# Patient Record
Sex: Female | Born: 1975 | Race: Black or African American | Hispanic: No | Marital: Married | Smoking: Never smoker
Health system: Southern US, Community
[De-identification: ages and names within clinical notes are randomized; demographics above are authoritative.]

## PROBLEM LIST (undated history)

## (undated) DIAGNOSIS — K589 Irritable bowel syndrome without diarrhea: Secondary | ICD-10-CM

## (undated) DIAGNOSIS — Z8619 Personal history of other infectious and parasitic diseases: Secondary | ICD-10-CM

## (undated) DIAGNOSIS — O09219 Supervision of pregnancy with history of pre-term labor, unspecified trimester: Secondary | ICD-10-CM

## (undated) DIAGNOSIS — I10 Essential (primary) hypertension: Secondary | ICD-10-CM

## (undated) DIAGNOSIS — O169 Unspecified maternal hypertension, unspecified trimester: Secondary | ICD-10-CM

## (undated) DIAGNOSIS — D6861 Antiphospholipid syndrome: Secondary | ICD-10-CM

## (undated) HISTORY — DX: Personal history of other infectious and parasitic diseases: Z86.19

## (undated) HISTORY — DX: Supervision of pregnancy with history of pre-term labor, unspecified trimester: O09.219

## (undated) HISTORY — DX: Irritable bowel syndrome, unspecified: K58.9

## (undated) HISTORY — DX: Unspecified maternal hypertension, unspecified trimester: O16.9

## (undated) HISTORY — DX: Essential (primary) hypertension: I10

## (undated) HISTORY — DX: Antiphospholipid syndrome: D68.61

---

## 2010-08-11 NOTE — L&D Delivery Note (Signed)
Delivery Note At 6:16 PM a viable and healthy female was delivered via Vaginal, Spontaneous Delivery (Presentation: Occiput Posterior).  APGAR: 9, 9; weight 7 lb 12 oz (3515 g).   Placenta status: complete, spontaneous. Cord: 3 vessels with the following complications: None.  Cord pH: not indicated.   Anesthesia: Epidural  Episiotomy: None Lacerations: 2nd degree;Perineal Suture Repair: 2.0 Vicryl Est. Blood Loss (mL): 400  Mom to postpartum.  Baby to with mother.  Clay Menser R 07/23/2011, 6:39 PM

## 2011-01-09 LAB — RUBELLA ANTIBODY, IGM: Rubella: IMMUNE

## 2011-01-09 LAB — RPR: RPR: NONREACTIVE

## 2011-01-09 LAB — ABO/RH: RH Type: POSITIVE

## 2011-02-24 ENCOUNTER — Other Ambulatory Visit (HOSPITAL_COMMUNITY): Payer: Self-pay | Admitting: Obstetrics & Gynecology

## 2011-02-24 DIAGNOSIS — O2690 Pregnancy related conditions, unspecified, unspecified trimester: Secondary | ICD-10-CM

## 2011-02-24 DIAGNOSIS — O3430 Maternal care for cervical incompetence, unspecified trimester: Secondary | ICD-10-CM

## 2011-02-24 DIAGNOSIS — Z3689 Encounter for other specified antenatal screening: Secondary | ICD-10-CM

## 2011-02-28 ENCOUNTER — Encounter (HOSPITAL_COMMUNITY): Payer: Self-pay

## 2011-02-28 ENCOUNTER — Ambulatory Visit (HOSPITAL_COMMUNITY)
Admission: RE | Admit: 2011-02-28 | Discharge: 2011-02-28 | Disposition: A | Discharge status: Home / Self Care | Payer: BC Managed Care – PPO | Source: Ambulatory Visit | Attending: Obstetrics & Gynecology | Admitting: Obstetrics & Gynecology

## 2011-02-28 DIAGNOSIS — O2690 Pregnancy related conditions, unspecified, unspecified trimester: Secondary | ICD-10-CM

## 2011-02-28 DIAGNOSIS — O358XX0 Maternal care for other (suspected) fetal abnormality and damage, not applicable or unspecified: Secondary | ICD-10-CM | POA: Insufficient documentation

## 2011-02-28 DIAGNOSIS — Z3689 Encounter for other specified antenatal screening: Secondary | ICD-10-CM

## 2011-02-28 DIAGNOSIS — Z363 Encounter for antenatal screening for malformations: Secondary | ICD-10-CM | POA: Insufficient documentation

## 2011-02-28 DIAGNOSIS — Z1389 Encounter for screening for other disorder: Secondary | ICD-10-CM | POA: Insufficient documentation

## 2011-02-28 DIAGNOSIS — O3430 Maternal care for cervical incompetence, unspecified trimester: Secondary | ICD-10-CM

## 2011-02-28 DIAGNOSIS — O2620 Pregnancy care for patient with recurrent pregnancy loss, unspecified trimester: Secondary | ICD-10-CM | POA: Insufficient documentation

## 2011-03-03 NOTE — Progress Notes (Signed)
MFM Consult Note  Ms. Monica Arroyo is a 35 year old G50P1A3 AA female at 18+[redacted] weeks gestation who presents for an ultrasound and consultation regarding the need for a cervical cerclage. Her pregnancies are as follows:  G1: SAB in first trimester; no D&C G2: SAB in first trimester; no D&C G3: SROM at 20 weeks with delivery 2 days later; denies contractions G4: Early cervical change with hospitalization at 24 weeks for BMZ; bedrest at home; induced at 38 weeks due to her diagnosis of antiphospholipid syndrome G5: Current: 17P starting at 16 weeks; serial CLs show no funneling but some shortening 3.2 - 2.4 cms  Ms. Monica Arroyo also has a diagnosis of chronic hypertension not requiring medication and APL syndrome for which she takes daily Lovenox injections.  Today's US revealed the cervix to be of normal length without funneling. We reviewed the literature concerning the efficacy of cerclages. Unfortunately, it is not clear if or when a cerclage is appropriate or would be of benefit. After a lengthy discussion, Ms. Monica Arroyo and I decided to continue the cervical CLs every 1-2 weeks. If there would be significant change before 23 weeks, a rescue cerclage could be considered; otherwise, would continue CLs until 26-28 weeks. Suggest giving BMZ with significant change after 23 weeks.   (Face-to-face consultation with patient: 30 min)

## 2011-03-12 ENCOUNTER — Inpatient Hospital Stay (HOSPITAL_COMMUNITY): Admission: AD | Admit: 2011-03-12 | Payer: Self-pay | Source: Ambulatory Visit | Admitting: Obstetrics & Gynecology

## 2011-07-21 ENCOUNTER — Telehealth (HOSPITAL_COMMUNITY): Payer: Self-pay | Admitting: *Deleted

## 2011-07-21 ENCOUNTER — Encounter (HOSPITAL_COMMUNITY): Payer: Self-pay | Admitting: *Deleted

## 2011-07-21 NOTE — Telephone Encounter (Signed)
Preadmission screen  

## 2011-07-23 ENCOUNTER — Inpatient Hospital Stay (HOSPITAL_COMMUNITY)
Admission: RE | Admit: 2011-07-23 | Discharge: 2011-07-25 | DRG: 372 | Disposition: A | Discharge status: Home / Self Care | Payer: BC Managed Care – PPO | Source: Ambulatory Visit | Attending: Obstetrics & Gynecology | Admitting: Obstetrics & Gynecology

## 2011-07-23 ENCOUNTER — Encounter (HOSPITAL_COMMUNITY): Payer: Self-pay

## 2011-07-23 ENCOUNTER — Inpatient Hospital Stay (HOSPITAL_COMMUNITY): Payer: BC Managed Care – PPO | Admitting: Anesthesiology

## 2011-07-23 ENCOUNTER — Encounter (HOSPITAL_COMMUNITY): Payer: Self-pay | Admitting: Anesthesiology

## 2011-07-23 ENCOUNTER — Inpatient Hospital Stay (HOSPITAL_COMMUNITY): Admission: AD | Admit: 2011-07-23 | Payer: Self-pay | Source: Ambulatory Visit | Admitting: Obstetrics & Gynecology

## 2011-07-23 DIAGNOSIS — O9912 Other diseases of the blood and blood-forming organs and certain disorders involving the immune mechanism complicating childbirth: Secondary | ICD-10-CM | POA: Diagnosis present

## 2011-07-23 DIAGNOSIS — O1002 Pre-existing essential hypertension complicating childbirth: Principal | ICD-10-CM | POA: Diagnosis present

## 2011-07-23 DIAGNOSIS — D6859 Other primary thrombophilia: Secondary | ICD-10-CM | POA: Diagnosis present

## 2011-07-23 DIAGNOSIS — D6861 Antiphospholipid syndrome: Secondary | ICD-10-CM | POA: Diagnosis present

## 2011-07-23 DIAGNOSIS — D689 Coagulation defect, unspecified: Secondary | ICD-10-CM | POA: Diagnosis present

## 2011-07-23 LAB — CBC
MCH: 32.5 pg (ref 26.0–34.0)
MCV: 94 fL (ref 78.0–100.0)
Platelets: 215 10*3/uL (ref 150–400)
RBC: 3.51 MIL/uL — ABNORMAL LOW (ref 3.87–5.11)

## 2011-07-23 LAB — RPR: RPR Ser Ql: NONREACTIVE

## 2011-07-23 MED ORDER — WITCH HAZEL-GLYCERIN EX PADS: 1.0000 "application " | MEDICATED_PAD | CUTANEOUS | Status: DC | PRN

## 2011-07-23 MED ORDER — TETANUS-DIPHTH-ACELL PERTUSSIS 5-2.5-18.5 LF-MCG/0.5 IM SUSP
0.5000 mL | Freq: Once | INTRAMUSCULAR | Status: AC
  Administered 2011-07-24: 0.5 mL via INTRAMUSCULAR
  Filled 2011-07-23: qty 0.5

## 2011-07-23 MED ORDER — EPHEDRINE 5 MG/ML INJ
10.0000 mg | INTRAVENOUS | Status: DC | PRN
  Filled 2011-07-23: qty 4

## 2011-07-23 MED ORDER — OXYTOCIN 20 UNITS IN LACTATED RINGERS INFUSION - SIMPLE
1.0000 m[IU]/min | INTRAVENOUS | Status: DC
  Administered 2011-07-23: 6 m[IU]/min via INTRAVENOUS
  Administered 2011-07-23: 2 m[IU]/min via INTRAVENOUS
  Administered 2011-07-23: 4 m[IU]/min via INTRAVENOUS
  Administered 2011-07-23: 3 m[IU]/min via INTRAVENOUS
  Administered 2011-07-23: 2 m[IU]/min via INTRAVENOUS
  Administered 2011-07-23: 5 m[IU]/min via INTRAVENOUS
  Filled 2011-07-23: qty 1000

## 2011-07-23 MED ORDER — DIPHENHYDRAMINE HCL 50 MG/ML IJ SOLN: 12.5000 mg | INTRAMUSCULAR | Status: DC | PRN

## 2011-07-23 MED ORDER — SIMETHICONE 80 MG PO CHEW: 80.0000 mg | CHEWABLE_TABLET | ORAL | Status: DC | PRN

## 2011-07-23 MED ORDER — LIDOCAINE HCL (PF) 1 % IJ SOLN
30.0000 mL | INTRAMUSCULAR | Status: DC | PRN
  Filled 2011-07-23: qty 30

## 2011-07-23 MED ORDER — IBUPROFEN 600 MG PO TABS
600.0000 mg | ORAL_TABLET | Freq: Four times a day (QID) | ORAL | Status: DC | PRN
  Administered 2011-07-23: 600 mg via ORAL
  Filled 2011-07-23: qty 1

## 2011-07-23 MED ORDER — ONDANSETRON HCL 4 MG PO TABS: 4.0000 mg | ORAL_TABLET | ORAL | Status: DC | PRN

## 2011-07-23 MED ORDER — DIBUCAINE 1 % RE OINT: 1.0000 "application " | TOPICAL_OINTMENT | RECTAL | Status: DC | PRN

## 2011-07-23 MED ORDER — ONDANSETRON HCL 4 MG/2ML IJ SOLN: 4.0000 mg | Freq: Four times a day (QID) | INTRAMUSCULAR | Status: DC | PRN

## 2011-07-23 MED ORDER — LACTATED RINGERS IV SOLN: 500.0000 mL | Freq: Once | INTRAVENOUS | Status: DC

## 2011-07-23 MED ORDER — OXYTOCIN BOLUS FROM INFUSION
500.0000 mL | Freq: Once | INTRAVENOUS | Status: DC
  Filled 2011-07-23: qty 500

## 2011-07-23 MED ORDER — IBUPROFEN 600 MG PO TABS
600.0000 mg | ORAL_TABLET | Freq: Four times a day (QID) | ORAL | Status: DC
  Administered 2011-07-24 – 2011-07-25 (×6): 600 mg via ORAL
  Filled 2011-07-23 (×6): qty 1

## 2011-07-23 MED ORDER — LACTATED RINGERS IV SOLN
500.0000 mL | INTRAVENOUS | Status: DC | PRN
  Administered 2011-07-23 (×2): 1000 mL via INTRAVENOUS
  Administered 2011-07-23: 500 mL via INTRAVENOUS

## 2011-07-23 MED ORDER — ZOLPIDEM TARTRATE 5 MG PO TABS: 5.0000 mg | ORAL_TABLET | Freq: Every evening | ORAL | Status: DC | PRN

## 2011-07-23 MED ORDER — SENNOSIDES-DOCUSATE SODIUM 8.6-50 MG PO TABS
2.0000 | ORAL_TABLET | Freq: Every day | ORAL | Status: DC
  Administered 2011-07-23 – 2011-07-24 (×2): 2 via ORAL

## 2011-07-23 MED ORDER — FENTANYL 2.5 MCG/ML BUPIVACAINE 1/10 % EPIDURAL INFUSION (WH - ANES)
INTRAMUSCULAR | Status: DC | PRN
  Administered 2011-07-23: 13 mL/h via EPIDURAL

## 2011-07-23 MED ORDER — ONDANSETRON HCL 4 MG/2ML IJ SOLN: 4.0000 mg | INTRAMUSCULAR | Status: DC | PRN

## 2011-07-23 MED ORDER — DIPHENHYDRAMINE HCL 25 MG PO CAPS: 25.0000 mg | ORAL_CAPSULE | Freq: Four times a day (QID) | ORAL | Status: DC | PRN

## 2011-07-23 MED ORDER — PHENYLEPHRINE 40 MCG/ML (10ML) SYRINGE FOR IV PUSH (FOR BLOOD PRESSURE SUPPORT)
80.0000 ug | PREFILLED_SYRINGE | INTRAVENOUS | Status: DC | PRN
  Filled 2011-07-23: qty 5

## 2011-07-23 MED ORDER — FENTANYL 2.5 MCG/ML BUPIVACAINE 1/10 % EPIDURAL INFUSION (WH - ANES)
14.0000 mL/h | INTRAMUSCULAR | Status: DC
  Filled 2011-07-23: qty 60

## 2011-07-23 MED ORDER — OXYTOCIN 20 UNITS IN LACTATED RINGERS INFUSION - SIMPLE: 125.0000 mL/h | Freq: Once | INTRAVENOUS | Status: DC

## 2011-07-23 MED ORDER — LANOLIN HYDROUS EX OINT: TOPICAL_OINTMENT | CUTANEOUS | Status: DC | PRN

## 2011-07-23 MED ORDER — LIDOCAINE HCL 1.5 % IJ SOLN
INTRAMUSCULAR | Status: DC | PRN
  Administered 2011-07-23 (×2): 4 mL via EPIDURAL

## 2011-07-23 MED ORDER — LACTATED RINGERS IV SOLN
INTRAVENOUS | Status: DC
  Administered 2011-07-23: 09:00:00 via INTRAVENOUS

## 2011-07-23 MED ORDER — OXYCODONE-ACETAMINOPHEN 5-325 MG PO TABS: 2.0000 | ORAL_TABLET | ORAL | Status: DC | PRN

## 2011-07-23 MED ORDER — PHENYLEPHRINE 40 MCG/ML (10ML) SYRINGE FOR IV PUSH (FOR BLOOD PRESSURE SUPPORT): 80.0000 ug | PREFILLED_SYRINGE | INTRAVENOUS | Status: DC | PRN

## 2011-07-23 MED ORDER — CITRIC ACID-SODIUM CITRATE 334-500 MG/5ML PO SOLN: 30.0000 mL | ORAL | Status: DC | PRN

## 2011-07-23 MED ORDER — OXYCODONE-ACETAMINOPHEN 5-325 MG PO TABS: 1.0000 | ORAL_TABLET | ORAL | Status: DC | PRN

## 2011-07-23 MED ORDER — BENZOCAINE-MENTHOL 20-0.5 % EX AERO
1.0000 "application " | INHALATION_SPRAY | CUTANEOUS | Status: DC | PRN
  Administered 2011-07-25: 1 via TOPICAL

## 2011-07-23 MED ORDER — PRENATAL PLUS 27-1 MG PO TABS
1.0000 | ORAL_TABLET | Freq: Every day | ORAL | Status: DC
  Administered 2011-07-24 – 2011-07-25 (×2): 1 via ORAL
  Filled 2011-07-23 (×2): qty 1

## 2011-07-23 MED ORDER — TERBUTALINE SULFATE 1 MG/ML IJ SOLN: 0.2500 mg | Freq: Once | INTRAMUSCULAR | Status: DC | PRN

## 2011-07-23 MED ORDER — EPHEDRINE 5 MG/ML INJ: 10.0000 mg | INTRAVENOUS | Status: DC | PRN

## 2011-07-23 MED ORDER — FLEET ENEMA 7-19 GM/118ML RE ENEM: 1.0000 | ENEMA | RECTAL | Status: DC | PRN

## 2011-07-23 MED ORDER — ACETAMINOPHEN 325 MG PO TABS: 650.0000 mg | ORAL_TABLET | ORAL | Status: DC | PRN

## 2011-07-23 NOTE — H&P (Signed)
Monica Arroyo is a 35 y.o. female presenting for labor IOL at 39 wks for Antiphospholipid ab.syndrome.  History [facility] at [facility] from 8 wks. Prior high risk hx, with 2 MAB in 1st trim, one 20 wks loss and one successful preg with preterm dilatation, bedrest with delivery at 38 wks. Hence placed on prophylactic Lovenox 40mg  daily and switched to Heparin since 36 wks. 17P inj since prior preterm dilatation without PTD.  CHTN stable, off meds. No preclampsia s/s.    OB History    Grav Para Term Preterm Abortions TAB SAB Ect Mult Living   5 2 1 1 2  0 2 0 0 1     Past Medical History  Diagnosis Date  . Pregnancy with history of pre-term labor   . Unspecified hypertension antepartum   . Unspecified essential hypertension .  Marland Kitchen Decreased fetal movements   . History of chicken pox   . IBS (irritable bowel syndrome)   . Antiphospholipid syndrome    No past surgical history on file. no surgeries.   Family History: family history includes Alcohol abuse in her brother; Diabetes in her father; Hearing loss in her maternal uncle; Heart attack in her maternal uncle; Hypertension in her mother; and Stroke in her paternal grandmother.  Social History:  reports that she has never smoked. She has never used smokeless tobacco. She reports that she does not drink alcohol or use illicit drugs.  Review of Systems  Constitutional: Negative for malaise/fatigue.  Eyes: Negative for blurred vision.  Cardiovascular: Negative for chest pain and leg swelling.  Gastrointestinal: Negative for heartburn.  Neurological: Negative for focal weakness and headaches.  Psychiatric/Behavioral: Negative for depression.     Last menstrual period 10/22/2010. Exam Physical Exam A&O x 3, no acute distress. Pleasant HEENT neg, no thyromegaly Lungs CTA bilat CV RRR, S1S2 normal Abdo soft, non tender, non acute Extr no edema/ tenderness Pelvic 2 cm/ 50% in office/ Vtx by exam and last office sono on 12/7.  FHT  140s/ +  acels/ no decels. Mod LTV, category I Toco occ Prenatal labs: ABO, Rh: A/Positive/-- (05/31 0000) Antibody: Negative (05/31 0000) Rubella: Immune (05/31 0000) RPR: Nonreactive (05/31 0000)  HBsAg: Negative (05/31 0000)  HIV: Non-reactive (05/31 0000)  GBS: Negative (11/16 0000)  Ultrascreen neg, AFP1 negative. 1 hr Glucola nl Ob anatomy sono nl. Serial sono normal growth.   Assessment/Plan: [id number] at 39.1/7 wks, for IOL for APL ab syndrome. Last Heparin inj 24 hrs back. GBS neg. Plan pitocin IOL, epidural per pt's choice.    Shalice Woodring R 07/23/2011, 7:16 AM

## 2011-07-23 NOTE — Anesthesia Preprocedure Evaluation (Signed)
Anesthesia Evaluation  Patient identified by MRN, date of birth, ID band Patient awake    Reviewed: Allergy & Precautions, H&P , Patient's Chart, lab work & pertinent test results  Airway Mallampati: III TM Distance: >3 FB Neck ROM: full    Dental No notable dental hx. (+) Teeth Intact   Pulmonary neg pulmonary ROS,  clear to auscultation  Pulmonary exam normal       Cardiovascular neg cardio ROS regular Normal    Neuro/Psych Negative Neurological ROS  Negative Psych ROS   GI/Hepatic negative GI ROS, Neg liver ROS, IBS   Endo/Other  Negative Endocrine ROS  Renal/GU negative Renal ROS  Genitourinary negative   Musculoskeletal   Abdominal Normal abdominal exam  (+)   Peds  Hematology Anti-Phospholipid antibody. On Heparin SQ last dose on 07/21/11.   Anesthesia Other Findings   Reproductive/Obstetrics (+) Pregnancy                           Anesthesia Physical Anesthesia Plan  ASA: II  Anesthesia Plan: Epidural   Post-op Pain Management:    Induction:   Airway Management Planned:   Additional Equipment:   Intra-op Plan:   Post-operative Plan:   Informed Consent: I have reviewed the patients History and Physical, chart, labs and discussed the procedure including the risks, benefits and alternatives for the proposed anesthesia with the patient or authorized representative who has indicated his/her understanding and acceptance.     Plan Discussed with: Anesthesiologist and Surgeon  Anesthesia Plan Comments:         Anesthesia Quick Evaluation

## 2011-07-23 NOTE — H&P (Signed)
Monica Arroyo is a 35 y.o. female presenting for labor IOL at 39 wks for Antiphospholipid ab.syndrome.  History  [facility] at [facility] from 8 wks. Prior high risk hx, with 2 MAB in 1st trim, one 20 wks loss and one successful preg with preterm dilatation, bedrest with delivery at 38 wks. Hence placed on prophylactic Lovenox 40mg  daily and switched to Heparin since 36 wks. 17P inj since prior preterm dilatation without PTD.  CHTN stable, off meds. No preclampsia s/s.    OB History    Grav Para Term Preterm Abortions TAB SAB Ect Mult Living   5 2 1 1 2  0 2 0 0 1     Past Medical History  Diagnosis Date  . Pregnancy with history of pre-term labor   . Unspecified hypertension antepartum   . Unspecified essential hypertension .  Marland Kitchen Decreased fetal movements   . History of chicken pox   . IBS (irritable bowel syndrome)   . Antiphospholipid syndrome    History reviewed. No pertinent past surgical history. no surgeries.   Family History: family history includes Alcohol abuse in her brother; Diabetes in her father; Hearing loss in her maternal uncle; Heart attack in her maternal uncle; Hypertension in her mother; and Stroke in her paternal grandmother.  Social History:  reports that she has never smoked. She has never used smokeless tobacco. She reports that she does not drink alcohol or use illicit drugs.  Review of Systems  Constitutional: Negative for malaise/fatigue.  Eyes: Negative for blurred vision.  Cardiovascular: Negative for chest pain and leg swelling.  Gastrointestinal: Negative for heartburn.  Neurological: Negative for focal weakness and headaches.  Psychiatric/Behavioral: Negative for depression.     Last menstrual period 10/22/2010. Exam  Physical Exam  A&O x 3, no acute distress. Pleasant HEENT neg, no thyromegaly Lungs CTA bilat CV RRR, S1S2 normal Abdo soft, non tender, non acute Extr no edema/ tenderness Pelvic 2 cm/ 50% in office/ Vtx by exam and last office sono  on 12/7.  FHT  140s/ + accels/ no decels/moderate variability - category I Toco  none Prenatal labs: ABO, Rh: A/Positive/-- (05/31 0000) Antibody: Negative (05/31 0000) Rubella: Immune (05/31 0000) RPR: Nonreactive (05/31 0000)  HBsAg: Negative (05/31 0000)  HIV: Non-reactive (05/31 0000)  GBS: Negative (11/16 0000)  Ultrascreen neg, AFP1 negative. 1 hr Glucola nl Ob anatomy sono nl. Serial sono normal growth.   Assessment/Plan: [id number] at 39.1/7 wks, for IOL for APL ab syndrome. Last Heparin inj 24 hrs back. GBS neg. Plan pitocin IOL, epidural per pt's choice.    Monica Arroyo 07/23/2011, 8:47 AM

## 2011-07-23 NOTE — Anesthesia Procedure Notes (Signed)
Epidural Patient location during procedure: OB Start time: 07/23/2011 2:51 PM  Staffing Anesthesiologist: Chameka Mcmullen A. Performed by: anesthesiologist   Preanesthetic Checklist Completed: patient identified, site marked, surgical consent, pre-op evaluation, timeout performed, IV checked, risks and benefits discussed and monitors and equipment checked  Epidural Patient position: sitting Prep: site prepped and draped and DuraPrep Patient monitoring: continuous pulse ox and blood pressure Approach: midline Injection technique: LOR air  Needle:  Needle type: Tuohy  Needle gauge: 17 G Needle length: 9 cm Needle insertion depth: 8 cm Catheter type: closed end flexible Catheter size: 19 Gauge Catheter at skin depth: 12 cm Test dose: negative and 1.5% lidocaine  Assessment Events: blood not aspirated, injection not painful, no injection resistance, negative IV test and no paresthesia  Additional Notes Patient is more comfortable after epidural dosed. Please see RN's note for documentation of vital signs and FHR which are stable.

## 2011-07-23 NOTE — Progress Notes (Signed)
Patient ID: Monica Arroyo, female   DOB: July 01, 1976, 35 y.o.   MRN: [id number] VS stable. Comfortable with UCs on pitocin. FHT 135/ + accels/ no decels/ mod LTV. Category I. Toco mild to palpate/ SVE 3/50%/-3/ Vtx, AROM, clear. Continue pit per protocol.

## 2011-07-23 NOTE — Progress Notes (Signed)
Patient ID: Monica Arroyo, female   DOB: 1975-12-08, 35 y.o.   MRN: [id number] IOL for APL ab.syndrome. On pitocin,s/p epidural. Pt has series of variable decels after epidural. Hence pitocin was turned off. We discussed terbutaline inj, but was needed since UCs spaced out and decels resolved. Now pt is stable, with pitocin off and left lat, O2 mask. FHT 120s/ moderate variability/ +accels/ no decels- category I. Toco - occ. SVE- complete but at 0 to +1, no caput. Plan to rptate pt in bed to assist descent. Reassess in 1 hr. Restart pitocin at low dose.

## 2011-07-24 LAB — CBC
HCT: 30.2 % — ABNORMAL LOW (ref 36.0–46.0)
Hemoglobin: 10.6 g/dL — ABNORMAL LOW (ref 12.0–15.0)
MCH: 33 pg (ref 26.0–34.0)
RBC: 3.21 MIL/uL — ABNORMAL LOW (ref 3.87–5.11)

## 2011-07-24 NOTE — Anesthesia Postprocedure Evaluation (Signed)
Anesthesia Post Note  Patient: Monica Arroyo  Procedure(s) Performed: * No procedures listed *  Anesthesia type: Epidural  Patient location: Mother/Baby  Post pain: Pain level controlled  Post assessment: Post-op Vital signs reviewed  Last Vitals:  Filed Vitals:   07/24/11 0516  BP: 116/75  Pulse: 71  Temp: 36.8 C  Resp: 18    Post vital signs: Reviewed  Level of consciousness:alert  Complications: No apparent anesthesia complications

## 2011-07-24 NOTE — Progress Notes (Signed)
Patient ID: Monica Arroyo, female   DOB: 1976/01/28, 35 y.o.   MRN: [id number]  PPD 1 SVD  S:  Reports feeling well -no pain             Tolerating po/ No nausea or vomiting             Bleeding is light             Pain controlled withnone and motrin only             Up ad lib / ambulatory  Newborn breast feeding  / female newborn   O:  A & O x 3 NAD             VS: Blood pressure 116/75, pulse 71, temperature 98.3 F (36.8 C), temperature source Oral, resp. rate 18, height 5\' 10"  (1.778 m), weight 114.306 kg (252 lb), last menstrual period 10/22/2010, SpO2 100.00%, unknown if currently breastfeeding.  LABS: Lab Results  Component Value Date   WBC 8.9 07/24/2011   HGB 10.6* 07/24/2011   HCT 30.2* 07/24/2011   MCV 94.1 07/24/2011   PLT 187 07/24/2011     I&O: I/O last 3 completed shifts: In: -  Out: 1700 [Urine:1300; Blood:400]      Lungs: Clear and unlabored  Heart: regular rate and rhythm / no mumurs  Abdomen: soft, non-tender, non-distended              Fundus: firm, non-tender, Ueven  Perineum: no edema / ice pack in place  Lochia: light  Extremities: trace edema, no calf pain or tenderness    A: PPD # 1   Doing well - stable status  P:  Routine post partum orders    [name] 07/24/2011, 9:22 AM

## 2011-07-25 MED ORDER — IBUPROFEN 600 MG PO TABS: 600.0000 mg | ORAL_TABLET | Freq: Four times a day (QID) | ORAL | Status: AC

## 2011-07-25 MED ORDER — BENZOCAINE-MENTHOL 20-0.5 % EX AERO
INHALATION_SPRAY | CUTANEOUS | Status: AC
  Administered 2011-07-25: 1 via TOPICAL
  Filled 2011-07-25: qty 56

## 2011-07-25 NOTE — Discharge Summary (Signed)
Obstetric Discharge Summary Reason for Admission: IOL @ 39wks d/t hx APL antibody syndrome Prenatal Procedures: NST, ultrasound and high risk mgmt d/t med hx APL syndrome (on Lovenox), hx previous PTB (given 17P) and hx cHTN (no meds needed this gestation) Intrapartum Procedures: spontaneous vaginal delivery and 2nd degree laceration with repair Postpartum Procedures: none Complications-Operative and Postpartum: none Hemoglobin  Date Value Range Status  07/24/2011 10.6* 12.0-15.0 (g/dL) Final     HCT  Date Value Range Status  07/24/2011 30.2* 36.0-46.0 (%) Final    Discharge Diagnoses: Term Pregnancy-delivered  Discharge Information: Date: 07/25/2011 Activity: pelvic rest Diet: routine Medications: Ibuprofen Condition: stable Instructions: refer to practice specific booklet Discharge to: home Follow-up Information    Follow up with MODY,VAISHALI R in 6 weeks.   Contact information:   [address] [place] [address] [phone]          Newborn Data: Live born female on 07/23/11 Birth Weight: 7 lb 12 oz (3515 g) APGAR: 9, 9  Home with mother.  Tinita Brooker K 07/25/2011, 9:30 AM

## 2011-07-25 NOTE — Progress Notes (Signed)
Patient ID: Monica Arroyo, female   DOB: 1976/01/25, 35 y.o.   MRN: [id number] PPD # 2  Subjective: Pt reports feeling well and eager for d/c home/ Pain controlled with prescription NSAID's including motrin Tolerating po/ Voiding without problems/ No n/v Bleeding is light/ Newborn info: Female / Feeding: breast    Objective:  VS: Blood pressure 125/84, pulse 85, temperature 98.4 F (36.9 C), temperature source Oral, resp. rate 18  Basename 07/24/11 0500 07/23/11 0840  WBC 8.9 6.9  HGB 10.6* 11.4*  HCT 30.2* 33.0*  PLT 187 215    Blood type: A/Positive/-- (05/31 0000) Rubella: Immune (05/31 0000)    Physical Exam:  General: alert, cooperative and no distress Abdomen: soft, nontender, normal bowel sounds Uterine Fundus: firm, below umbilicus, nontender Perineum: healing with good reapproximation Lochia: minimal Ext: Homans sign is negative, no sign of DVT and no edema, redness or tenderness in the calves or thighs   A/P: PPD # 2/ [id number] S/P SVD w/ 2nd degree laceration Hx APL antibody syndrome; lovenox d/c'ed Hx HTN; stable and no meds needed at this time Doing well and stable for discharge home RX: Ibuprofen 600mg  po Q 6 hrs prn pain #30 Refill x 1 [facility] booklet given Routine pp visit in 6wks  Signed: Arlana Lindau [facility] 07/25/11 1478

## 2011-07-25 NOTE — Discharge Summary (Signed)
Agree 

## 2012-12-26 ENCOUNTER — Encounter (HOSPITAL_COMMUNITY): Payer: Self-pay | Admitting: Emergency Medicine

## 2012-12-26 ENCOUNTER — Emergency Department (HOSPITAL_COMMUNITY)
Admission: EM | Admit: 2012-12-26 | Discharge: 2012-12-27 | Disposition: A | Discharge status: Home / Self Care | Payer: PRIVATE HEALTH INSURANCE | Attending: Emergency Medicine | Admitting: Emergency Medicine

## 2012-12-26 ENCOUNTER — Emergency Department (HOSPITAL_COMMUNITY): Payer: PRIVATE HEALTH INSURANCE

## 2012-12-26 DIAGNOSIS — Z79899 Other long term (current) drug therapy: Secondary | ICD-10-CM | POA: Insufficient documentation

## 2012-12-26 DIAGNOSIS — Z8619 Personal history of other infectious and parasitic diseases: Secondary | ICD-10-CM | POA: Insufficient documentation

## 2012-12-26 DIAGNOSIS — I10 Essential (primary) hypertension: Secondary | ICD-10-CM | POA: Insufficient documentation

## 2012-12-26 DIAGNOSIS — D6859 Other primary thrombophilia: Secondary | ICD-10-CM | POA: Insufficient documentation

## 2012-12-26 DIAGNOSIS — S335XXA Sprain of ligaments of lumbar spine, initial encounter: Secondary | ICD-10-CM | POA: Insufficient documentation

## 2012-12-26 DIAGNOSIS — K589 Irritable bowel syndrome without diarrhea: Secondary | ICD-10-CM | POA: Insufficient documentation

## 2012-12-26 DIAGNOSIS — Y929 Unspecified place or not applicable: Secondary | ICD-10-CM | POA: Insufficient documentation

## 2012-12-26 DIAGNOSIS — S39012A Strain of muscle, fascia and tendon of lower back, initial encounter: Secondary | ICD-10-CM

## 2012-12-26 DIAGNOSIS — Y998 Other external cause status: Secondary | ICD-10-CM | POA: Insufficient documentation

## 2012-12-26 DIAGNOSIS — Z3202 Encounter for pregnancy test, result negative: Secondary | ICD-10-CM | POA: Insufficient documentation

## 2012-12-26 DIAGNOSIS — X500XXA Overexertion from strenuous movement or load, initial encounter: Secondary | ICD-10-CM | POA: Insufficient documentation

## 2012-12-26 DIAGNOSIS — Z8742 Personal history of other diseases of the female genital tract: Secondary | ICD-10-CM | POA: Insufficient documentation

## 2012-12-26 MED ORDER — KETOROLAC TROMETHAMINE 60 MG/2ML IM SOLN
60.0000 mg | Freq: Once | INTRAMUSCULAR | Status: AC
  Administered 2012-12-26: 60 mg via INTRAMUSCULAR
  Filled 2012-12-26: qty 2

## 2012-12-26 NOTE — ED Notes (Signed)
C/o R sided lower back pain that radiates to mid lower back.  Pt states pain started 1 week ago and went away after 2 days.  Pain started again today when she bent over to pick up her child.  Denies known injury.

## 2012-12-26 NOTE — ED Provider Notes (Signed)
History    This chart was scribed for Jaynie Crumble, non-physician practitioner working with Carleene Cooper III, MD by Leone Payor, ED Scribe. This patient was seen in room TR07C/TR07C and the patient's care was started at 2153.   CSN: [id number]  Arrival date & time 12/26/12  2153   First MD Initiated Contact with Patient 12/26/12 2217      Chief Complaint  Patient presents with  . Back Pain     The history is provided by the patient. No language interpreter was used.    HPI Comments: Monica Arroyo is a 37 y.o. female who presents to the Emergency Department complaining of R sided lower back pain that radiates to mid lower back starting 5 hours ago. Pt states the pain started 1 week ago and improved after 2 days. States the pain returned today after bending to pick up her child. She has taken tylenol with no relief. She denies change in bowel or bladder function, weakness in legs, numbness or tingling.    Past Medical History  Diagnosis Date  . Pregnancy with history of pre-term labor   . Unspecified hypertension antepartum   . Unspecified essential hypertension .  Marland Kitchen Decreased fetal movements   . History of chicken pox   . IBS (irritable bowel syndrome)   . Antiphospholipid syndrome     History reviewed. No pertinent past surgical history.  Family History  Problem Relation Age of Onset  . Hypertension Mother   . Diabetes Father   . Alcohol abuse Brother     x2  . Hearing loss Maternal Uncle   . Heart attack Maternal Uncle   . Stroke Paternal Grandmother     History  Substance Use Topics  . Smoking status: Never Smoker   . Smokeless tobacco: Never Used  . Alcohol Use: Yes    OB History   Grav Para Term Preterm Abortions TAB SAB Ect Mult Living   5 3 2 1 2  0 2 0 0 2      Review of Systems  Musculoskeletal: Positive for back pain.  Neurological: Negative for weakness and numbness.    Allergies  Review of patient's allergies indicates no known  allergies.  Home Medications   Current Outpatient Rx  Name  Route  Sig  Dispense  Refill  . acetaminophen (TYLENOL) 500 MG tablet   Oral   Take 1,000 mg by mouth every 6 (six) hours as needed for pain.         . fluticasone (FLONASE) 50 MCG/ACT nasal spray   Nasal   Place 2 sprays into the nose daily.           BP 144/93  Pulse 76  Temp(Src) 97.9 F (36.6 C) (Oral)  Resp 16  SpO2 98%  LMP 12/25/2012  Physical Exam  Nursing note and vitals reviewed. Constitutional: She is oriented to person, place, and time. She appears well-developed and well-nourished. No distress.  HENT:  Head: Normocephalic and atraumatic.  Eyes: EOM are normal.  Neck: Neck supple. No tracheal deviation present.  Cardiovascular: Normal rate.   Pulmonary/Chest: Effort normal. No respiratory distress.  Musculoskeletal: Normal range of motion. She exhibits no edema.  Midline L spine tenderness. Right SI joint tenderness. Pain with R straight leg raise. 5/5 strength in R foot. Dorsal pedal pulses are normal   Neurological: She is alert and oriented to person, place, and time.  Skin: Skin is warm and dry.  Psychiatric: She has a normal mood and affect. Her  behavior is normal.    ED Course  Procedures (including critical care time)  DIAGNOSTIC STUDIES: Oxygen Saturation is 98% on room air, normal by my interpretation.    COORDINATION OF CARE: 11:11 PM Discussed treatment plan with pt at bedside and pt agreed to plan.   Labs Reviewed - No data to display No results found.   1. Lumbar strain, initial encounter       MDM  Pt with right sided lumbar pain radiating into right thigh. No hx of the same. No numbness or weakness in extremities. Neurovascularly intact. No red flags to suggest cauda equina. Pt treated in ED with toradol IM and prednisone. Xray and ua negative. Pt is on her menstrual cycle, thus blood in urine. Pt will be started on mobic at home, prednisone taper for 4 more days,  flexeril, norco for severe pain. Plan to follow up with pcp. Suspect muscular strain vs possible radicular pain. Pt voiced understanding and importance of follow up with pcp.   Filed Vitals:   12/26/12 2202  BP: 144/93  Pulse: 76  Temp: 97.9 F (36.6 C)  TempSrc: Oral  Resp: 16  SpO2: 98%       I personally performed the services described in this documentation, which was scribed in my presence. The recorded information has been reviewed and is accurate.   Lottie Mussel, PA-C 12/27/12 0040

## 2012-12-27 LAB — URINE MICROSCOPIC-ADD ON

## 2012-12-27 LAB — PREGNANCY, URINE: Preg Test, Ur: NEGATIVE

## 2012-12-27 LAB — URINALYSIS, ROUTINE W REFLEX MICROSCOPIC
Glucose, UA: NEGATIVE mg/dL
Ketones, ur: NEGATIVE mg/dL
Protein, ur: 30 mg/dL — AB

## 2012-12-27 MED ORDER — PREDNISONE 20 MG PO TABS
60.0000 mg | ORAL_TABLET | Freq: Once | ORAL | Status: AC
  Administered 2012-12-27: 60 mg via ORAL
  Filled 2012-12-27: qty 3

## 2012-12-27 MED ORDER — CYCLOBENZAPRINE HCL 10 MG PO TABS: 10.0000 mg | ORAL_TABLET | Freq: Two times a day (BID) | ORAL | Status: AC | PRN

## 2012-12-27 MED ORDER — MELOXICAM 15 MG PO TABS: 15.0000 mg | ORAL_TABLET | Freq: Every day | ORAL | Status: AC

## 2012-12-27 MED ORDER — PREDNISONE 10 MG PO TABS: ORAL_TABLET | ORAL | Status: AC

## 2012-12-27 MED ORDER — HYDROCODONE-ACETAMINOPHEN 5-325 MG PO TABS: 1.0000 | ORAL_TABLET | Freq: Four times a day (QID) | ORAL | Status: AC | PRN

## 2012-12-27 NOTE — ED Provider Notes (Signed)
Medical screening examination/treatment/procedure(s) were performed by non-physician practitioner and as supervising physician I was immediately available for consultation/collaboration.   Vera Wishart III, MD 12/27/12 1258 

## 2012-12-27 NOTE — ED Notes (Signed)
Pt discharged.Vital signs stable and GCS 15 

## 2012-12-28 LAB — URINE CULTURE

## 2013-09-08 ENCOUNTER — Ambulatory Visit: Payer: BC Managed Care – PPO | Admitting: Dietician

## 2013-10-11 ENCOUNTER — Ambulatory Visit: Payer: BC Managed Care – PPO | Admitting: Dietician

## 2013-10-24 IMAGING — CR DG LUMBAR SPINE COMPLETE 4+V
5 series · 5 of 5 positions shown · non-contrast
Comparison: None

CLINICAL DATA: Low back and right leg pain.

LUMBAR SPINE - COMPLETE 4+ VIEW

[t lumbar spine ap]
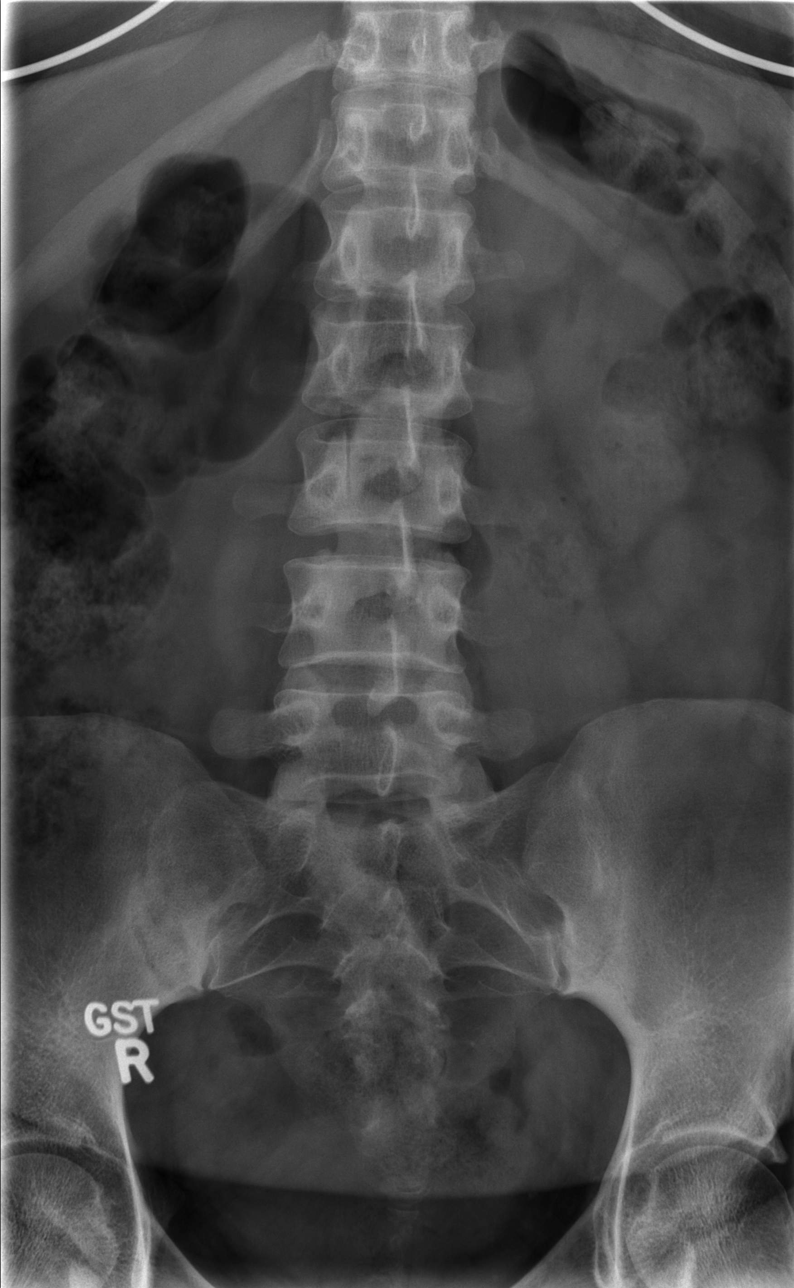

[t lumbar spine obl (1 of 2)]
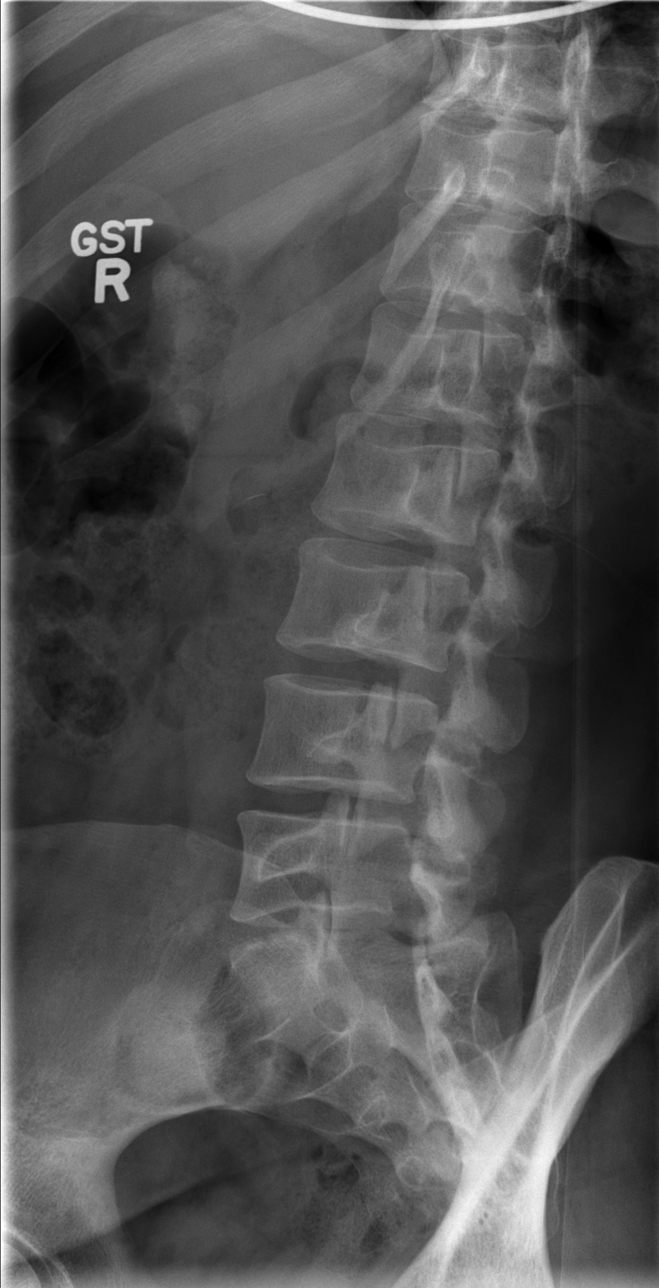

[t lumbar spine obl (2 of 2)]
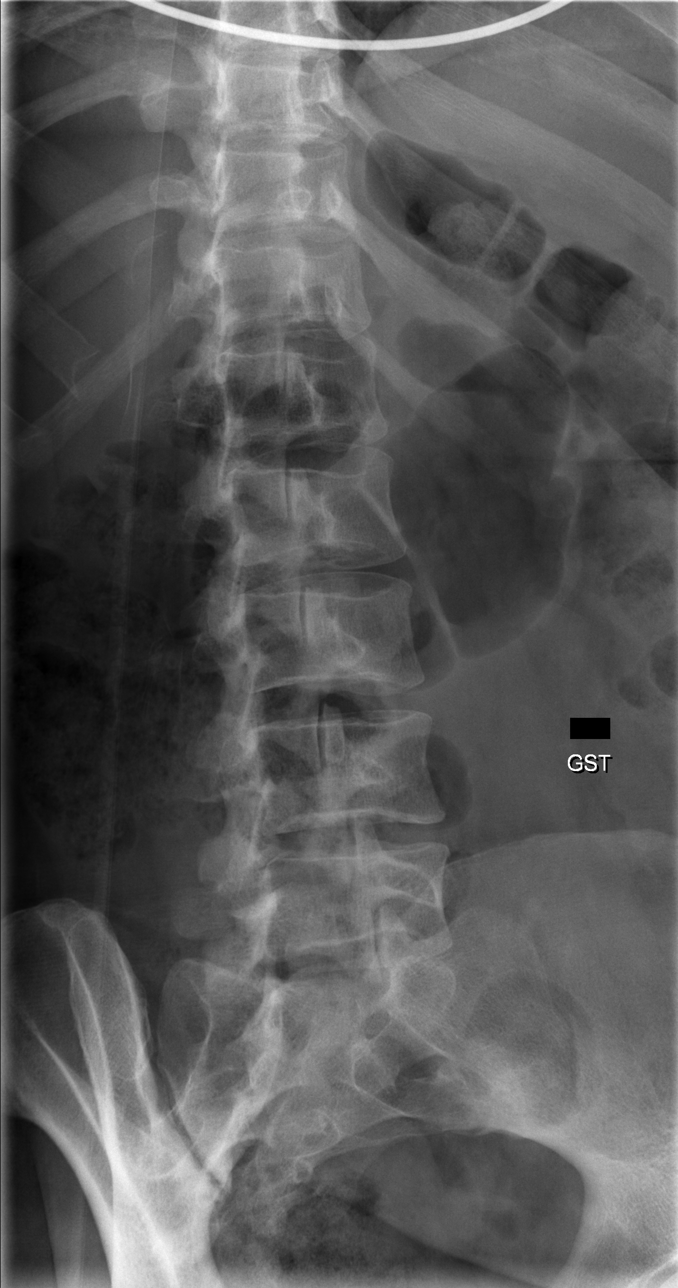

[t lumbar spine lat]
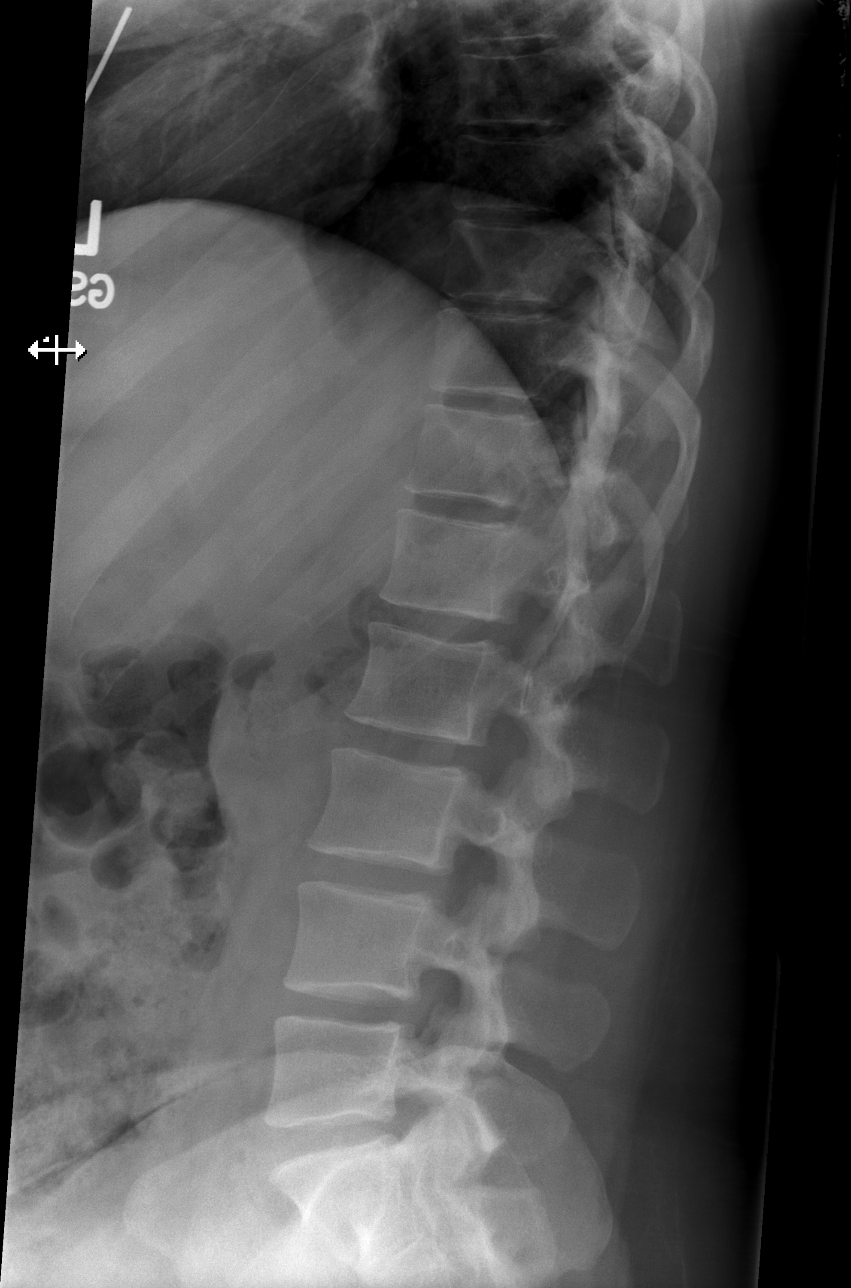

[t lumbar l-5 s-1 spot]
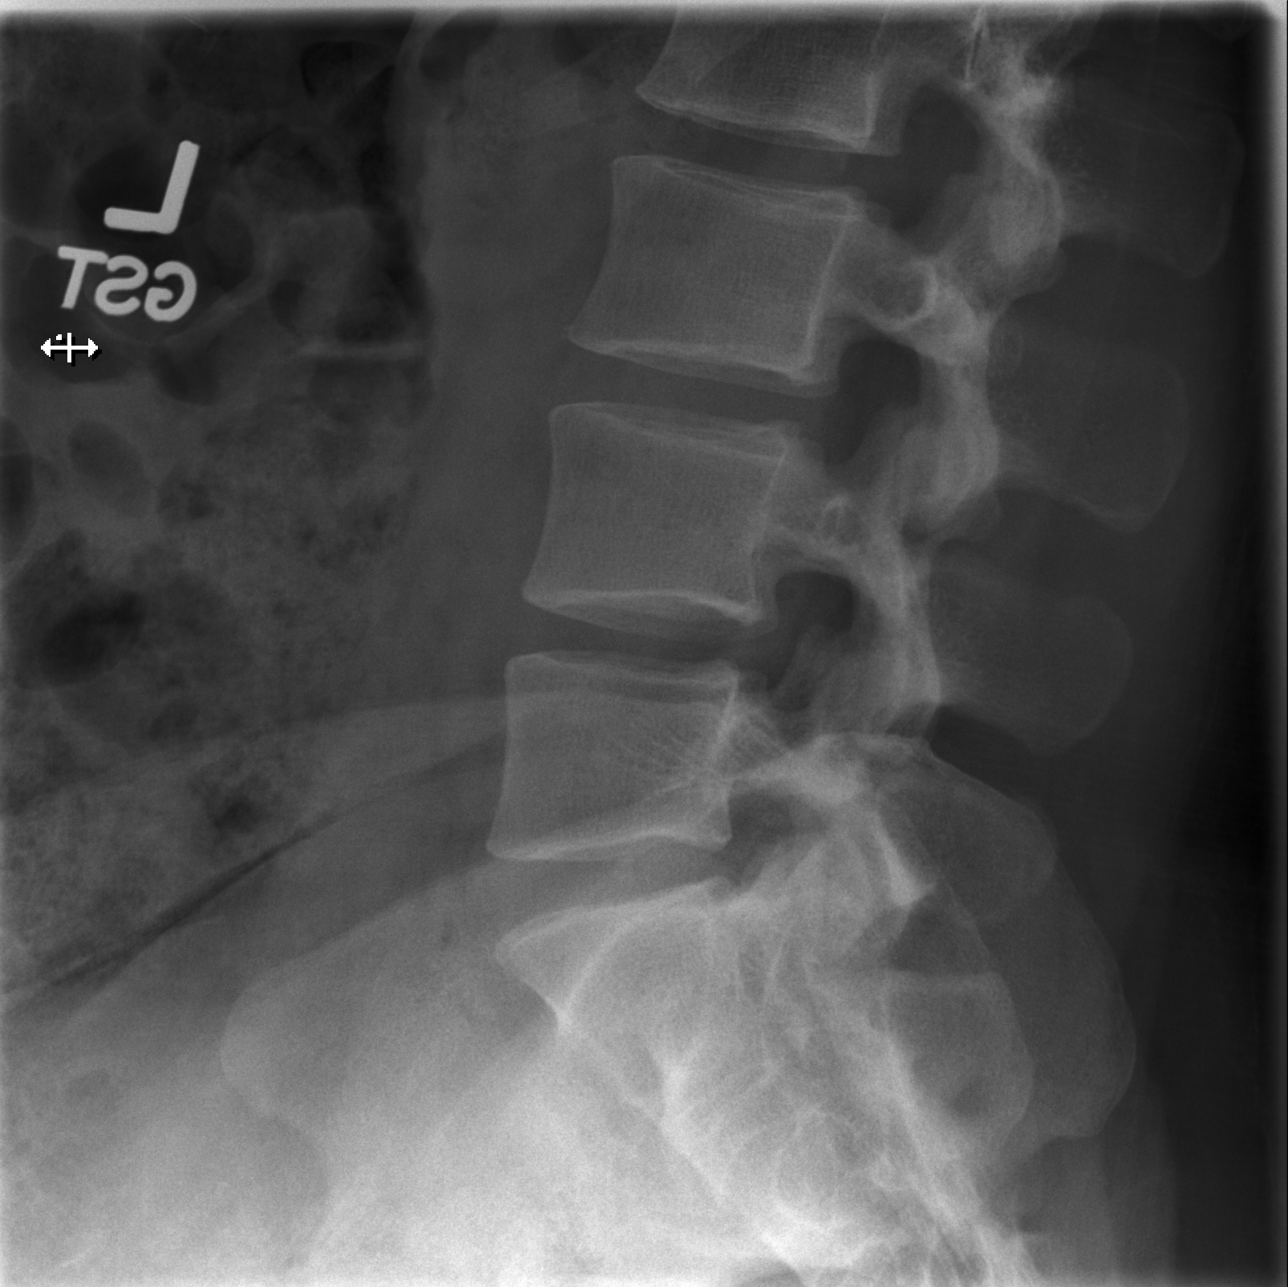

[5 of 5 positions shown; findings below may reference images not displayed]

FINDINGS: The lateral film demonstrates normal alignment.
Vertebral bodies and disc spaces are maintained.  No acute bony
findings.  Normal alignment of the facet joints and no pars
defects.  The visualized bony pelvis in intact.
IMPRESSION: Normal alignment and no acute bony findings or degenerative
changes.

## 2014-06-12 ENCOUNTER — Encounter (HOSPITAL_COMMUNITY): Payer: Self-pay | Admitting: Emergency Medicine

## 2016-11-21 ENCOUNTER — Other Ambulatory Visit: Payer: Self-pay | Admitting: Family Medicine

## 2016-11-21 DIAGNOSIS — Z1231 Encounter for screening mammogram for malignant neoplasm of breast: Secondary | ICD-10-CM

## 2016-12-11 ENCOUNTER — Encounter (INDEPENDENT_AMBULATORY_CARE_PROVIDER_SITE_OTHER): Payer: Self-pay

## 2016-12-11 ENCOUNTER — Ambulatory Visit
Admission: RE | Admit: 2016-12-11 | Discharge: 2016-12-11 | Disposition: A | Discharge status: Home / Self Care | Payer: 59 | Source: Ambulatory Visit | Attending: Family Medicine | Admitting: Family Medicine

## 2016-12-11 DIAGNOSIS — Z1231 Encounter for screening mammogram for malignant neoplasm of breast: Secondary | ICD-10-CM

## 2016-12-12 ENCOUNTER — Other Ambulatory Visit: Payer: Self-pay | Admitting: Family Medicine

## 2016-12-12 DIAGNOSIS — R928 Other abnormal and inconclusive findings on diagnostic imaging of breast: Secondary | ICD-10-CM

## 2016-12-16 ENCOUNTER — Ambulatory Visit
Admission: RE | Admit: 2016-12-16 | Discharge: 2016-12-16 | Disposition: A | Discharge status: Home / Self Care | Payer: PRIVATE HEALTH INSURANCE | Source: Ambulatory Visit | Attending: Family Medicine | Admitting: Family Medicine

## 2016-12-16 ENCOUNTER — Other Ambulatory Visit: Payer: Self-pay | Admitting: Family Medicine

## 2016-12-16 DIAGNOSIS — R928 Other abnormal and inconclusive findings on diagnostic imaging of breast: Secondary | ICD-10-CM

## 2016-12-16 DIAGNOSIS — N6489 Other specified disorders of breast: Secondary | ICD-10-CM

## 2016-12-17 ENCOUNTER — Other Ambulatory Visit: Payer: Self-pay | Admitting: Family Medicine

## 2016-12-17 DIAGNOSIS — N6489 Other specified disorders of breast: Secondary | ICD-10-CM

## 2016-12-18 ENCOUNTER — Other Ambulatory Visit: Payer: PRIVATE HEALTH INSURANCE

## 2017-06-19 ENCOUNTER — Other Ambulatory Visit: Payer: PRIVATE HEALTH INSURANCE

## 2018-02-24 DIAGNOSIS — Z23 Encounter for immunization: Secondary | ICD-10-CM | POA: Diagnosis not present

## 2018-02-24 DIAGNOSIS — Z789 Other specified health status: Secondary | ICD-10-CM | POA: Diagnosis not present

## 2018-02-24 DIAGNOSIS — Z1159 Encounter for screening for other viral diseases: Secondary | ICD-10-CM | POA: Diagnosis not present

## 2018-03-23 DIAGNOSIS — Z021 Encounter for pre-employment examination: Secondary | ICD-10-CM | POA: Diagnosis not present

## 2018-05-20 DIAGNOSIS — Z23 Encounter for immunization: Secondary | ICD-10-CM | POA: Diagnosis not present

## 2018-08-27 DIAGNOSIS — Z23 Encounter for immunization: Secondary | ICD-10-CM | POA: Diagnosis not present

## 2018-10-05 ENCOUNTER — Other Ambulatory Visit: Payer: Self-pay | Admitting: Family Medicine

## 2018-10-05 DIAGNOSIS — Z1231 Encounter for screening mammogram for malignant neoplasm of breast: Secondary | ICD-10-CM

## 2018-10-08 DIAGNOSIS — Z6831 Body mass index (BMI) 31.0-31.9, adult: Secondary | ICD-10-CM | POA: Diagnosis not present

## 2018-10-08 DIAGNOSIS — N926 Irregular menstruation, unspecified: Secondary | ICD-10-CM | POA: Diagnosis not present

## 2018-10-08 DIAGNOSIS — Z3041 Encounter for surveillance of contraceptive pills: Secondary | ICD-10-CM | POA: Diagnosis not present

## 2018-11-04 ENCOUNTER — Ambulatory Visit: Payer: PRIVATE HEALTH INSURANCE

## 2019-02-03 ENCOUNTER — Ambulatory Visit: Payer: PRIVATE HEALTH INSURANCE

## 2019-02-03 ENCOUNTER — Telehealth: Payer: Self-pay

## 2019-02-03 DIAGNOSIS — Z20822 Contact with and (suspected) exposure to covid-19: Secondary | ICD-10-CM

## 2019-02-03 NOTE — Telephone Encounter (Signed)
Incoming call from [facility] Dr.  Ernie Hew Office requesting Patient be tested for Covid -19.  Patient schedule for Friday 01/2519 @ 1200pm.  Patient voiced understanding.

## 2019-02-04 ENCOUNTER — Other Ambulatory Visit: Payer: PRIVATE HEALTH INSURANCE

## 2019-02-04 DIAGNOSIS — Z20822 Contact with and (suspected) exposure to covid-19: Secondary | ICD-10-CM

## 2019-02-10 LAB — NOVEL CORONAVIRUS, NAA: SARS-CoV-2, NAA: NOT DETECTED

## 2019-02-15 ENCOUNTER — Other Ambulatory Visit: Payer: Self-pay | Admitting: Family Medicine

## 2019-02-15 DIAGNOSIS — N6489 Other specified disorders of breast: Secondary | ICD-10-CM

## 2019-03-07 ENCOUNTER — Other Ambulatory Visit: Payer: Self-pay | Admitting: Family Medicine

## 2019-03-07 DIAGNOSIS — N6489 Other specified disorders of breast: Secondary | ICD-10-CM

## 2019-03-07 DIAGNOSIS — N63 Unspecified lump in unspecified breast: Secondary | ICD-10-CM

## 2019-03-23 ENCOUNTER — Ambulatory Visit: Payer: PRIVATE HEALTH INSURANCE

## 2019-03-23 ENCOUNTER — Ambulatory Visit: Admission: RE | Admit: 2019-03-23 | Payer: PRIVATE HEALTH INSURANCE | Source: Ambulatory Visit

## 2019-03-23 ENCOUNTER — Ambulatory Visit
Admission: RE | Admit: 2019-03-23 | Discharge: 2019-03-23 | Disposition: A | Discharge status: Home / Self Care | Payer: 59 | Source: Ambulatory Visit | Attending: Family Medicine | Admitting: Family Medicine

## 2019-03-23 ENCOUNTER — Other Ambulatory Visit: Payer: Self-pay

## 2019-03-23 DIAGNOSIS — N6489 Other specified disorders of breast: Secondary | ICD-10-CM

## 2020-04-26 DIAGNOSIS — I10 Essential (primary) hypertension: Secondary | ICD-10-CM | POA: Diagnosis not present

## 2020-04-26 DIAGNOSIS — D649 Anemia, unspecified: Secondary | ICD-10-CM | POA: Diagnosis not present

## 2020-04-26 DIAGNOSIS — R002 Palpitations: Secondary | ICD-10-CM | POA: Diagnosis not present

## 2020-04-26 DIAGNOSIS — F4321 Adjustment disorder with depressed mood: Secondary | ICD-10-CM | POA: Diagnosis not present

## 2020-05-01 DIAGNOSIS — F331 Major depressive disorder, recurrent, moderate: Secondary | ICD-10-CM | POA: Diagnosis not present

## 2020-05-01 DIAGNOSIS — R002 Palpitations: Secondary | ICD-10-CM | POA: Diagnosis not present

## 2020-05-01 DIAGNOSIS — E669 Obesity, unspecified: Secondary | ICD-10-CM | POA: Diagnosis not present

## 2020-05-01 DIAGNOSIS — Z6837 Body mass index (BMI) 37.0-37.9, adult: Secondary | ICD-10-CM | POA: Diagnosis not present

## 2020-05-01 DIAGNOSIS — F411 Generalized anxiety disorder: Secondary | ICD-10-CM | POA: Diagnosis not present

## 2020-06-11 DIAGNOSIS — G4700 Insomnia, unspecified: Secondary | ICD-10-CM | POA: Diagnosis not present

## 2020-06-11 DIAGNOSIS — R002 Palpitations: Secondary | ICD-10-CM | POA: Diagnosis not present

## 2020-06-11 DIAGNOSIS — D649 Anemia, unspecified: Secondary | ICD-10-CM | POA: Diagnosis not present

## 2020-06-11 DIAGNOSIS — E782 Mixed hyperlipidemia: Secondary | ICD-10-CM | POA: Diagnosis not present

## 2020-06-11 DIAGNOSIS — I10 Essential (primary) hypertension: Secondary | ICD-10-CM | POA: Diagnosis not present

## 2020-06-11 DIAGNOSIS — F4321 Adjustment disorder with depressed mood: Secondary | ICD-10-CM | POA: Diagnosis not present

## 2020-07-16 DIAGNOSIS — I10 Essential (primary) hypertension: Secondary | ICD-10-CM | POA: Diagnosis not present

## 2022-06-20 DIAGNOSIS — Z0279 Encounter for issue of other medical certificate: Secondary | ICD-10-CM | POA: Diagnosis not present

## 2022-09-25 DIAGNOSIS — I10 Essential (primary) hypertension: Secondary | ICD-10-CM | POA: Diagnosis not present

## 2022-09-25 DIAGNOSIS — L309 Dermatitis, unspecified: Secondary | ICD-10-CM | POA: Diagnosis not present

## 2022-09-25 DIAGNOSIS — R002 Palpitations: Secondary | ICD-10-CM | POA: Diagnosis not present

## 2022-10-03 DIAGNOSIS — Z0000 Encounter for general adult medical examination without abnormal findings: Secondary | ICD-10-CM | POA: Diagnosis not present

## 2022-11-26 DIAGNOSIS — Z13220 Encounter for screening for lipoid disorders: Secondary | ICD-10-CM | POA: Diagnosis not present

## 2022-11-26 DIAGNOSIS — Z0000 Encounter for general adult medical examination without abnormal findings: Secondary | ICD-10-CM | POA: Diagnosis not present

## 2022-11-26 DIAGNOSIS — Z114 Encounter for screening for human immunodeficiency virus [HIV]: Secondary | ICD-10-CM | POA: Diagnosis not present

## 2022-12-05 ENCOUNTER — Other Ambulatory Visit: Payer: Self-pay | Admitting: Family Medicine

## 2022-12-05 DIAGNOSIS — Z0000 Encounter for general adult medical examination without abnormal findings: Secondary | ICD-10-CM | POA: Diagnosis not present

## 2022-12-05 DIAGNOSIS — L989 Disorder of the skin and subcutaneous tissue, unspecified: Secondary | ICD-10-CM | POA: Diagnosis not present

## 2022-12-05 DIAGNOSIS — Z1231 Encounter for screening mammogram for malignant neoplasm of breast: Secondary | ICD-10-CM

## 2023-01-09 ENCOUNTER — Ambulatory Visit
Admission: RE | Admit: 2023-01-09 | Discharge: 2023-01-09 | Disposition: A | Discharge status: Home / Self Care | Payer: BC Managed Care – PPO | Source: Ambulatory Visit | Attending: Family Medicine | Admitting: Family Medicine

## 2023-01-09 DIAGNOSIS — Z1231 Encounter for screening mammogram for malignant neoplasm of breast: Secondary | ICD-10-CM

## 2023-02-17 DIAGNOSIS — Z1211 Encounter for screening for malignant neoplasm of colon: Secondary | ICD-10-CM | POA: Diagnosis not present

## 2023-02-17 DIAGNOSIS — I10 Essential (primary) hypertension: Secondary | ICD-10-CM | POA: Diagnosis not present

## 2023-07-20 ENCOUNTER — Ambulatory Visit: Payer: BC Managed Care – PPO | Admitting: Dermatology

## 2023-07-20 ENCOUNTER — Encounter: Payer: Self-pay | Admitting: Dermatology

## 2023-07-20 DIAGNOSIS — D216 Benign neoplasm of connective and other soft tissue of trunk, unspecified: Secondary | ICD-10-CM

## 2023-07-20 DIAGNOSIS — D485 Neoplasm of uncertain behavior of skin: Secondary | ICD-10-CM

## 2023-07-20 DIAGNOSIS — D492 Neoplasm of unspecified behavior of bone, soft tissue, and skin: Secondary | ICD-10-CM | POA: Diagnosis not present

## 2023-07-20 DIAGNOSIS — L300 Nummular dermatitis: Secondary | ICD-10-CM

## 2023-07-20 DIAGNOSIS — L219 Seborrheic dermatitis, unspecified: Secondary | ICD-10-CM | POA: Diagnosis not present

## 2023-07-20 MED ORDER — TRIAMCINOLONE ACETONIDE 0.1 % EX OINT: 1.0000 | TOPICAL_OINTMENT | Freq: Every day | CUTANEOUS | 0 refills | Status: AC | PRN

## 2023-07-20 NOTE — Patient Instructions (Addendum)
Hello Monica Arroyo,  Thank you for visiting my office today. Your dedication to addressing your dermatological concerns and improving your health is greatly appreciated. Here is a summary of the key instructions from today's consultation:  - Shave Biopsy Procedure:   - Procedure Details: The procedure involves numbing the area with lidocaine, shaving off the growth, cauterizing the base, and then covering it with ointment and a Band-Aid.   - Post-Procedure Care: Please keep the area clean and apply Aquaphor daily for a week.  - Biopsy Results:   - Communication: Results will be communicated through MyChart. If the growth is benign, you will receive a message. For any abnormalities, you will receive a direct call.  - Nummular Eczema Treatment:   - Application: Apply triamcinolone ointment to the affected areas twice a day for up to two weeks.   - Duration: If the condition improves within a week, you may stop using the ointment earlier. After two weeks, give your skin a break. For severe cases, use the ointment intermittently (two weeks on, two weeks off).  - Seborrheic Dermatitis:   - Treatment: Use DHS Zinc shampoo once a week for a month, then every two weeks thereafter. Do not extend hair washing beyond three weeks.   - Oil Treatments: When using oil treatments, apply the oil without massaging it into the scalp, leave it for a few hours, then wash it out.  - General Skin Care:   - Hydration: Maintain skin hydration by moisturizing after showers.   - Soaps: Use mild soaps like Dove and avoid hot water.   - Cold Weather Care: Use the prescribed treatments as needed, especially during cold weather to prevent eczema flares.  Please follow these instructions carefully and do not hesitate to contact us if you have any questions or concerns. We have included all this information in your after-visit summary.  Wishing you a healthy and joyful holiday season.  Best regards,  Dr. [facility]       Patient Handout: Wound Care for Skin Biopsy Site  Taking Care of Your Skin Biopsy Site  Proper care of the biopsy site is essential for promoting healing and minimizing scarring. This handout provides instructions on how to care for your biopsy site to ensure optimal recovery.  1. Cleaning the Wound:  Clean the biopsy site daily with gentle soap and water. Gently pat the area dry with a clean, soft towel. Avoid harsh scrubbing or rubbing the area, as this can irritate the skin and delay healing.  2. Applying Aquaphor and Bandage:  After cleaning the wound, apply a thin layer of Aquaphor ointment to the biopsy site. Cover the area with a sterile bandage to protect it from dirt, bacteria, and friction. Change the bandage daily or as needed if it becomes soiled or wet.  3. Continued Care for One Week:  Repeat the cleaning, Aquaphor application, and bandaging process daily for one week following the biopsy procedure. Keeping the wound clean and moist during this initial healing period will help prevent infection and promote optimal healing.  4. Massaging Aquaphor into the Area:  ---After one week, discontinue the use of bandages but continue to apply Aquaphor to the biopsy site. ----Gently massage the Aquaphor into the area using circular motions. ---Massaging the skin helps to promote circulation and prevent the formation of scar tissue.   Additional Tips:  Avoid exposing the biopsy site to direct sunlight during the healing process, as this can cause hyperpigmentation or worsen scarring.  If you experience any signs of infection, such as increased redness, swelling, warmth, or drainage from the wound, contact your healthcare provider immediately. Follow any additional instructions provided by your healthcare provider for caring for the biopsy site and managing any discomfort. Conclusion:  Taking proper care of your skin biopsy site is crucial for  ensuring optimal healing and minimizing scarring. By following these instructions for cleaning, applying Aquaphor, and massaging the area, you can promote a smooth and successful recovery. If you have any questions or concerns about caring for your biopsy site, don't hesitate to contact your healthcare provider for guidance.   Important Information  Due to recent changes in healthcare laws, you may see results of your pathology and/or laboratory studies on MyChart before the doctors have had a chance to review them. We understand that in some cases there may be results that are confusing or concerning to you. Please understand that not all results are received at the same time and often the doctors may need to interpret multiple results in order to provide you with the best plan of care or course of treatment. Therefore, we ask that you please give [place] 2 business days to thoroughly review all your results before contacting the office for clarification. Should we see a critical lab result, you will be contacted sooner.   If You Need Anything After Your Visit  If you have any questions or concerns for your doctor, please call our main line at [phone] If no one answers, please leave a voicemail as directed and we will return your call as soon as possible. Messages left after 4 pm will be answered the following business day.   You may also send [place] a message via MyChart. We typically respond to MyChart messages within 1-2 business days.  For prescription refills, please ask your pharmacy to contact our office. Our fax number is [phone].  If you have an urgent issue when the clinic is closed that cannot wait until the next business day, you can page your doctor at the number below.    Please note that while we do our best to be available for urgent issues outside of office hours, we are not available 24/7.   If you have an urgent issue and are unable to reach [place], you may choose to seek medical  care at your doctor's office, retail clinic, urgent care center, or emergency room.  If you have a medical emergency, please immediately call 911 or go to the emergency department. In the event of inclement weather, please call our main line at [phone] for an update on the status of any delays or closures.  Dermatology Medication Tips: Please keep the boxes that topical medications come in in order to help keep track of the instructions about where and how to use these. Pharmacies typically print the medication instructions only on the boxes and not directly on the medication tubes.   If your medication is too expensive, please contact our office at [phone] or send [place] a message through MyChart.   We are unable to tell what your co-pay for medications will be in advance as this is different depending on your insurance coverage. However, we may be able to find a substitute medication at lower cost or fill out paperwork to get insurance to cover a needed medication.   If a prior authorization is required to get your medication covered by your insurance company, please allow [place] 1-2 business days to complete this process.  Drug  prices often vary depending on where the prescription is filled and some pharmacies may offer cheaper prices.  The website www.goodrx.com contains coupons for medications through different pharmacies. The prices here do not account for what the cost may be with help from insurance (it may be cheaper with your insurance), but the website can give you the price if you did not use any insurance.  - You can print the associated coupon and take it with your prescription to the pharmacy.  - You may also stop by our office during regular business hours and pick up a GoodRx coupon card.  - If you need your prescription sent electronically to a different pharmacy, notify our office through [facility] or by phone at [phone]

## 2023-07-20 NOTE — Progress Notes (Signed)
New Patient Visit   Subjective  Monica Arroyo is a 47 y.o. female who presents for the following: New Pt - Growth  Patient states she has skin tag located at the back that she would like to have examined. Patient reports the areas have been there for 1 year. She reports the areas are not bothersome.Patient rates irritation 0 out of 10. She states that the areas have not spread but has increased in size. Patient reports she has not previously been treated for these areas. Patient denies Hx of bx. Patient denies family history of skin cancer(s).   The following portions of the chart were reviewed this encounter and updated as appropriate: medications, allergies, medical history  Review of Systems:  No other skin or systemic complaints except as noted in HPI or Assessment and Plan.  Objective  Well appearing patient in no apparent distress; mood and affect are within normal limits.  .  A focused examination was performed of the following areas:   Relevant exam findings are noted in the Assessment and Plan.          Assessment & Plan   1. Soft Pedunculated Nodule - Assessment: Patient presents with a soft, pedunculated nodule that has been present for over a year, initially starting as a small pimple and subsequently growing. The lesion does not appear cancerous or dangerous. Differential diagnosis includes neurofibroma, lipoma, or nevus lipomatosis. - Plan: Perform shave-removal biopsy under local anesthesia with lidocaine. Send tissue to lab for histopathological examination. Post-procedure, apply ointment and Band-Aid. Instruct patient to keep the area clean and apply Aquaphor daily for a week. Communicate results via MyChart if benign, or by phone call if abnormal findings are present.  2. Nummular Eczema - Assessment: Patient presents with coin-like patches on the trunk and extremities, consistent with nummular eczema. - Plan: Prescribe triamcinolone ointment to be applied to  affected areas twice daily for two weeks. Discontinue if cleared within one week. For severe cases, adopt a regimen of two weeks on, two weeks off. Advise aggressive hydration post-shower, recommend using Dove soap, avoid hot water, and use cream as needed, especially in cold weather for maintenance.  3. Seborrheic Dermatitis - Assessment: Patient presents with symptoms consistent with seborrheic dermatitis of the scalp. - Plan: Recommend DHS Zinc shampoo, to be used once weekly for a month, then every two weeks. Advise against stretching hair washing beyond three weeks. Instruct on proper hair oiling technique: apply oil, leave for a few hours, then wash out. Avoid regular massaging of oil into the scalp.  Neoplasm of uncertain behavior of skin Left Lower Back  Skin / nail biopsy Type of biopsy: tangential   Informed consent: discussed and consent obtained   Timeout: patient name, date of birth, surgical site, and procedure verified   Procedure prep:  Patient was prepped and draped in usual sterile fashion Prep type:  Isopropyl alcohol Anesthesia: the lesion was anesthetized in a standard fashion   Anesthetic:  1% lidocaine w/ epinephrine 1-100,000 buffered w/ 8.4% NaHCO3 Instrument used: DermaBlade   Hemostasis achieved with: aluminum chloride   Outcome: patient tolerated procedure well   Post-procedure details: sterile dressing applied and wound care instructions given   Dressing type: petrolatum gauze and bandage      No follow-ups on file.    Documentation: I have reviewed the above documentation for accuracy and completeness, and I agree with the above.   I, Shirron Marcha Solders, CMA, am acting as Neurosurgeon for [employer], DO.  Langston Reusing, DO

## 2023-07-23 LAB — SURGICAL PATHOLOGY

## 2023-07-27 NOTE — Progress Notes (Signed)
Hi Monica Arroyo  Dr. Onalee Hua reviewed your biopsy results and they showed the spot removed was benign (not cancerous).  No additional treatment is required.  The detailed report is available to view in MyChart.  Have a great day!  Kind Regards,  Dr. Kermit Balo Care Team

## 2023-08-24 ENCOUNTER — Ambulatory Visit: Payer: BC Managed Care – PPO | Admitting: Dermatology
